# Patient Record
Sex: Female | Born: 1995 | Race: Black or African American | Hispanic: No | Marital: Single | Smoking: Never smoker
Health system: Southern US, Community
[De-identification: ages and names within clinical notes are randomized; demographics above are authoritative.]

---
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
## 2016-04-15 ENCOUNTER — Emergency Department (HOSPITAL_COMMUNITY)
Admission: EM | Admit: 2016-04-15 | Discharge: 2016-04-15 | Disposition: A | Discharge status: Home / Self Care | Payer: Managed Care, Other (non HMO) | Attending: Emergency Medicine | Admitting: Emergency Medicine

## 2016-04-15 ENCOUNTER — Encounter (HOSPITAL_COMMUNITY): Payer: Self-pay | Admitting: Emergency Medicine

## 2016-04-15 ENCOUNTER — Ambulatory Visit (HOSPITAL_COMMUNITY)
Admission: EM | Admit: 2016-04-15 | Discharge: 2016-04-15 | Disposition: A | Discharge status: Home / Self Care | Payer: Managed Care, Other (non HMO) | Attending: Family Medicine | Admitting: Family Medicine

## 2016-04-15 DIAGNOSIS — R1030 Lower abdominal pain, unspecified: Secondary | ICD-10-CM | POA: Diagnosis present

## 2016-04-15 DIAGNOSIS — Z5321 Procedure and treatment not carried out due to patient leaving prior to being seen by health care provider: Secondary | ICD-10-CM | POA: Insufficient documentation

## 2016-04-15 LAB — URINALYSIS, MICROSCOPIC (REFLEX)

## 2016-04-15 LAB — URINALYSIS, ROUTINE W REFLEX MICROSCOPIC
Glucose, UA: NEGATIVE mg/dL
Ketones, ur: NEGATIVE mg/dL
Nitrite: POSITIVE — AB
PH: 8.5 — AB (ref 5.0–8.0)
Protein, ur: 100 mg/dL — AB
Specific Gravity, Urine: 1.02 (ref 1.005–1.030)

## 2016-04-15 LAB — POC URINE PREG, ED: Preg Test, Ur: NEGATIVE

## 2016-04-15 NOTE — ED Notes (Signed)
Patient left ED.  

## 2016-04-15 NOTE — ED Triage Notes (Signed)
Pt sent for lower abd pain in ovaries; pt sts had negative pregnancy test today at PCP; pt sts currently on period

## 2016-11-19 ENCOUNTER — Other Ambulatory Visit (HOSPITAL_COMMUNITY): Payer: Self-pay | Admitting: General Practice

## 2016-11-19 ENCOUNTER — Emergency Department (HOSPITAL_COMMUNITY): Payer: Self-pay

## 2016-11-19 ENCOUNTER — Emergency Department (HOSPITAL_COMMUNITY)
Admission: EM | Admit: 2016-11-19 | Discharge: 2016-11-20 | Disposition: A | Discharge status: Home / Self Care | Payer: Self-pay | Attending: Emergency Medicine | Admitting: Emergency Medicine

## 2016-11-19 ENCOUNTER — Encounter (HOSPITAL_COMMUNITY): Payer: Self-pay | Admitting: Emergency Medicine

## 2016-11-19 DIAGNOSIS — N739 Female pelvic inflammatory disease, unspecified: Secondary | ICD-10-CM | POA: Insufficient documentation

## 2016-11-19 DIAGNOSIS — R112 Nausea with vomiting, unspecified: Secondary | ICD-10-CM | POA: Insufficient documentation

## 2016-11-19 DIAGNOSIS — R102 Pelvic and perineal pain: Secondary | ICD-10-CM

## 2016-11-19 DIAGNOSIS — N730 Acute parametritis and pelvic cellulitis: Secondary | ICD-10-CM

## 2016-11-19 LAB — URINALYSIS, ROUTINE W REFLEX MICROSCOPIC
BILIRUBIN URINE: NEGATIVE
Glucose, UA: NEGATIVE mg/dL
Ketones, ur: NEGATIVE mg/dL
LEUKOCYTES UA: NEGATIVE
NITRITE: POSITIVE — AB
PROTEIN: NEGATIVE mg/dL
Specific Gravity, Urine: 1.02 (ref 1.005–1.030)
pH: 5 (ref 5.0–8.0)

## 2016-11-19 LAB — COMPREHENSIVE METABOLIC PANEL
ALT: 15 U/L (ref 14–54)
ANION GAP: 11 (ref 5–15)
AST: 19 U/L (ref 15–41)
Albumin: 4.4 g/dL (ref 3.5–5.0)
Alkaline Phosphatase: 72 U/L (ref 38–126)
BILIRUBIN TOTAL: 0.7 mg/dL (ref 0.3–1.2)
BUN: 11 mg/dL (ref 6–20)
CHLORIDE: 104 mmol/L (ref 101–111)
CO2: 25 mmol/L (ref 22–32)
Calcium: 9.6 mg/dL (ref 8.9–10.3)
Creatinine, Ser: 0.77 mg/dL (ref 0.44–1.00)
GFR calc Af Amer: >60 mL/min (ref >60)
GFR calc non Af Amer: >60 mL/min (ref >60)
GLUCOSE: 88 mg/dL (ref 65–99)
POTASSIUM: 3.2 mmol/L — AB (ref 3.5–5.1)
SODIUM: 140 mmol/L (ref 135–145)
TOTAL PROTEIN: 8.8 g/dL — AB (ref 6.5–8.1)

## 2016-11-19 LAB — WET PREP, GENITAL
Clue Cells Wet Prep HPF POC: NONE SEEN
Sperm: NONE SEEN
Trich, Wet Prep: NONE SEEN
Yeast Wet Prep HPF POC: NONE SEEN

## 2016-11-19 LAB — CBC
HEMATOCRIT: 35.3 % — AB (ref 36.0–46.0)
HEMOGLOBIN: 12 g/dL (ref 12.0–15.0)
MCH: 29.9 pg (ref 26.0–34.0)
MCHC: 34 g/dL (ref 30.0–36.0)
MCV: 88 fL (ref 78.0–100.0)
Platelets: 370 10*3/uL (ref 150–400)
RBC: 4.01 MIL/uL (ref 3.87–5.11)
RDW: 13.6 % (ref 11.5–15.5)
WBC: 14.9 10*3/uL — ABNORMAL HIGH (ref 4.0–10.5)

## 2016-11-19 LAB — I-STAT BETA HCG BLOOD, ED (MC, WL, AP ONLY)

## 2016-11-19 LAB — LIPASE, BLOOD: LIPASE: 21 U/L (ref 11–51)

## 2016-11-19 MED ORDER — ONDANSETRON HCL 4 MG/2ML IJ SOLN
4.0000 mg | Freq: Once | INTRAMUSCULAR | Status: AC
  Administered 2016-11-19: 4 mg via INTRAVENOUS
  Filled 2016-11-19: qty 2

## 2016-11-19 MED ORDER — IOPAMIDOL (ISOVUE-300) INJECTION 61%
INTRAVENOUS | Status: AC
  Filled 2016-11-19: qty 100

## 2016-11-19 MED ORDER — IOPAMIDOL (ISOVUE-300) INJECTION 61%
100.0000 mL | Freq: Once | INTRAVENOUS | Status: AC | PRN
  Administered 2016-11-19: 100 mL via INTRAVENOUS

## 2016-11-19 MED ORDER — MORPHINE SULFATE (PF) 2 MG/ML IV SOLN
4.0000 mg | Freq: Once | INTRAVENOUS | Status: AC
  Administered 2016-11-19: 4 mg via INTRAVENOUS
  Filled 2016-11-19: qty 2

## 2016-11-19 MED ORDER — SODIUM CHLORIDE 0.9 % IV BOLUS (SEPSIS)
1000.0000 mL | Freq: Once | INTRAVENOUS | Status: AC
  Administered 2016-11-19: 1000 mL via INTRAVENOUS

## 2016-11-19 NOTE — ED Triage Notes (Signed)
Pt complaint of lower abd/pelvic pain with associated nausea; menstrual ended yesterday. No vaginal bleeding/discharge today.

## 2016-11-19 NOTE — ED Provider Notes (Signed)
[facility]-EMERGENCY DEPT Provider Note   CSN: [id number] Arrival date & time: 11/19/16  1616     History   Chief Complaint Chief Complaint  Patient presents with  . Abdominal Pain    HPI Sally Beltran is a 21 y.o. female.  The history is provided by the patient.  Abdominal Pain   This is a recurrent problem. The current episode started yesterday. Episode frequency: intemittent. The problem has not changed since onset.Associated with: menstrual cycle. The pain is located in the RLQ and LLQ. The quality of the pain is cramping. The pain is moderate. Associated symptoms include nausea and vomiting (nbnb). Pertinent negatives include fever, diarrhea, dysuria, hematuria and arthralgias. The symptoms are aggravated by activity. Nothing relieves the symptoms.   LMP: 11/12/16 and just finished yesterday. No prior STDs.   History reviewed. No pertinent past medical history.  There are no active problems to display for this patient.   History reviewed. No pertinent surgical history.  OB History    No data available       Home Medications    Prior to Admission medications   Not on File    Family History No family history on file.  Social History Social History  Substance Use Topics  . Smoking status: Never Smoker  . Smokeless tobacco: Never Used  . Alcohol use No     Allergies   Patient has no known allergies.   Review of Systems Review of Systems  Constitutional: Negative for chills and fever.  HENT: Negative for ear pain and sore throat.   Eyes: Negative for pain and visual disturbance.  Respiratory: Negative for cough and shortness of breath.   Cardiovascular: Negative for chest pain and palpitations.  Gastrointestinal: Positive for abdominal pain, nausea and vomiting (nbnb). Negative for diarrhea.  Genitourinary: Positive for pelvic pain. Negative for dysuria, hematuria, vaginal bleeding and vaginal discharge.  Musculoskeletal: Negative for arthralgias and back  pain.  Skin: Negative for color change and rash.  Neurological: Negative for seizures and syncope.  All other systems reviewed and are negative. All other systems are reviewed and are negative for acute change except as noted in the HPI    Physical Exam Updated Vital Signs BP (!) 130/105 (BP Location: Right Arm)   Pulse (!) 101   Temp 99.5 F (37.5 C) (Oral)   Resp 18   Ht 4\' 10"  (1.473 m)   Wt 76.5 kg (168 lb 9.6 oz)   LMP 11/12/2016   SpO2 100%   BMI 35.24 kg/m   Physical Exam  Constitutional: She is oriented to person, place, and time. She appears well-developed and well-nourished. No distress.  HENT:  Head: Normocephalic and atraumatic.  Nose: Nose normal.  Eyes: Pupils are equal, round, and reactive to light. Conjunctivae and EOM are normal. Right eye exhibits no discharge. Left eye exhibits no discharge. No scleral icterus.  Neck: Normal range of motion. Neck supple.  Cardiovascular: Normal rate and regular rhythm.  Exam reveals no gallop and no friction rub.   No murmur heard. Pulmonary/Chest: Effort normal and breath sounds normal. No stridor. No respiratory distress. She has no rales.  Abdominal: Soft. She exhibits no distension. There is tenderness in the right lower quadrant, suprapubic area and left lower quadrant. There is no rigidity, no rebound and no guarding. No hernia.  Genitourinary: Pelvic exam was performed with patient supine. Uterus is tender. Cervix exhibits motion tenderness and discharge. Cervix exhibits no friability. Right adnexum displays tenderness and fullness. Right adnexum displays  no mass. Left adnexum displays tenderness. Left adnexum displays no mass. No erythema or bleeding in the vagina. No foreign body in the vagina. No signs of injury around the vagina. Vaginal discharge (moderate yellow discharge) found.  Genitourinary Comments: Chaperone present during pelvic exam.   Musculoskeletal: She exhibits no edema or tenderness.  Neurological: She  is alert and oriented to person, place, and time.  Skin: Skin is warm and dry. No rash noted. She is not diaphoretic. No erythema.  Psychiatric: She has a normal mood and affect.  Vitals reviewed.    ED Treatments / Results  Labs (all labs ordered are listed, but only abnormal results are displayed) Labs Reviewed  WET PREP, GENITAL - Abnormal; Notable for the following:       Result Value   WBC, Wet Prep HPF POC MANY (*)    All other components within normal limits  COMPREHENSIVE METABOLIC PANEL - Abnormal; Notable for the following:    Potassium 3.2 (*)    Total Protein 8.8 (*)    All other components within normal limits  CBC - Abnormal; Notable for the following:    WBC 14.9 (*)    HCT 35.3 (*)    All other components within normal limits  URINALYSIS, ROUTINE W REFLEX MICROSCOPIC - Abnormal; Notable for the following:    APPearance HAZY (*)    Hgb urine dipstick SMALL (*)    Nitrite POSITIVE (*)    Bacteria, UA RARE (*)    Squamous Epithelial / LPF 0-5 (*)    All other components within normal limits  LIPASE, BLOOD  I-STAT BETA HCG BLOOD, ED ([facility], [facility], [facility] ONLY)  GC/CHLAMYDIA PROBE AMP ([place]) NOT AT [facility]    EKG  EKG Interpretation None       Radiology No results found.  Procedures Procedures (including critical care time)  Medications Ordered in ED Medications  iopamidol (ISOVUE-300) 61 % injection (not administered)  sodium chloride 0.9 % bolus 1,000 mL (1,000 mLs Intravenous New Bag/Given 11/19/16 2304)  ondansetron (ZOFRAN) injection 4 mg (4 mg Intravenous Given 11/19/16 2304)  morphine 2 MG/ML injection 4 mg (4 mg Intravenous Given 11/19/16 2304)  iopamidol (ISOVUE-300) 61 % injection 100 mL (100 mLs Intravenous Contrast Given 11/19/16 2338)     Initial Impression / Assessment and Plan / ED Course  I have reviewed the triage vital signs and the nursing notes.  Pertinent labs & imaging results that were available during my care of the patient were  reviewed by me and considered in my medical decision making (see chart for details).     Lower abdominal pain for 2 days.  Exam without evidence of cervicitis however patient does have moderate purulent discharge. Severe lower abdominal tenderness, worse in the right lower quadrant and right adnexa.  Labs with leukocytosis. Beta hCG negative. Wet prep with positive leukocytes but no evidence of Trichomonas or clue cells. GC/chlamydia swab was sent.  Presentation is more concerning for appendicitis versus PID versus TOA.  We'll obtain CT scan. CT revealed right adnexal fluid collection more concerning for TOA. Pelvic ultrasound ordered for that her evaluation.  Patient care turned over to Dr Wilkie AyeHorton at 0000. Patient case and results discussed in detail; please see their note for further ED managment.       Nira Connardama, Heavyn Yearsley Eduardo, MD 11/19/16 [phone]

## 2016-11-20 ENCOUNTER — Emergency Department (HOSPITAL_COMMUNITY): Payer: Self-pay

## 2016-11-20 MED ORDER — AZITHROMYCIN 250 MG PO TABS
1000.0000 mg | ORAL_TABLET | Freq: Once | ORAL | Status: AC
  Administered 2016-11-20: 1000 mg via ORAL
  Filled 2016-11-20: qty 4

## 2016-11-20 MED ORDER — LIDOCAINE HCL 1 % IJ SOLN
INTRAMUSCULAR | Status: AC
  Administered 2016-11-20: 02:00:00
  Filled 2016-11-20: qty 20

## 2016-11-20 MED ORDER — DOXYCYCLINE HYCLATE 100 MG PO CAPS: 100.0000 mg | ORAL_CAPSULE | Freq: Two times a day (BID) | ORAL | 0 refills | Status: AC

## 2016-11-20 MED ORDER — CEFTRIAXONE SODIUM 250 MG IJ SOLR
250.0000 mg | Freq: Once | INTRAMUSCULAR | Status: AC
  Administered 2016-11-20: 250 mg via INTRAMUSCULAR
  Filled 2016-11-20: qty 250

## 2016-11-20 NOTE — Discharge Instructions (Signed)
You were seen today for abdominal pain. You may have pelvic inflammatory disease which is sometimes associated with STDs. You're tested and treated for STDs. You will be placed on antibiotics. If you have any new or worsening symptoms she should follow-up immediately. [facility] provided at [facility].

## 2016-11-20 NOTE — ED Provider Notes (Signed)
Patient signed out pending ultrasound.  Ultrasound shows inflammation of the pelvis but no fluid collection. I discussed the results with the patient. She was given IM Rocephin and azithromycin to cover for STDs. She will also be sent home on doxycycline with concerns for pelvic inflammatory disease. She was given strict return precautions. She is nontoxic on repeat evaluation and her questions were answered.  After history, exam, and medical workup I feel the patient has been appropriately medically screened and is safe for discharge home. Pertinent diagnoses were discussed with the patient. Patient was given return precautions.    Shon BatonHorton, Oceana Walthall F, MD 11/20/16 [phone]

## 2016-11-21 LAB — GC/CHLAMYDIA PROBE AMP (~~LOC~~) NOT AT ARMC
Chlamydia: NEGATIVE
Neisseria Gonorrhea: POSITIVE — AB

## 2017-08-20 IMAGING — US US ART/VEN ABD/PELV/SCROTUM DOPPLER LTD
1 series · 13 of 25 positions shown · non-contrast
Comparison: CT 11/19/2016

CLINICAL DATA: Pelvic pain and pressure.  Nausea.  Duration 1 day.

EXAM:
TRANSABDOMINAL AND TRANSVAGINAL ULTRASOUND OF PELVIS
DOPPLER ULTRASOUND OF OVARIES
TECHNIQUE: Both transabdominal and transvaginal ultrasound examinations of the
pelvis were performed. Transabdominal technique was performed for
global imaging of the pelvis including uterus, ovaries, adnexal
regions, and pelvic cul-de-sac.
It was necessary to proceed with endovaginal exam following the
transabdominal exam to visualize the ovaries. Color and duplex
Doppler ultrasound was utilized to evaluate blood flow to the
ovaries.

[Series 1: us art/ven abd/pelv/scrotum doppler ltd · 0.22mm/px · 79 acquisitions, 13 frames shown]
[im 1/79]
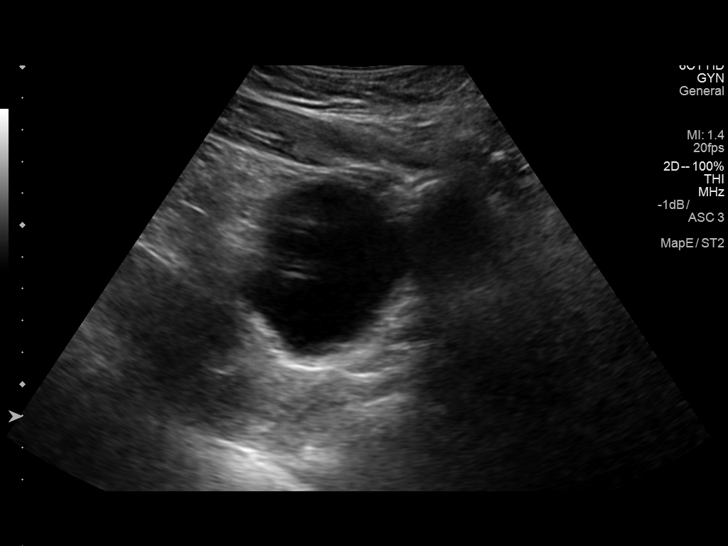
[im 7/79]
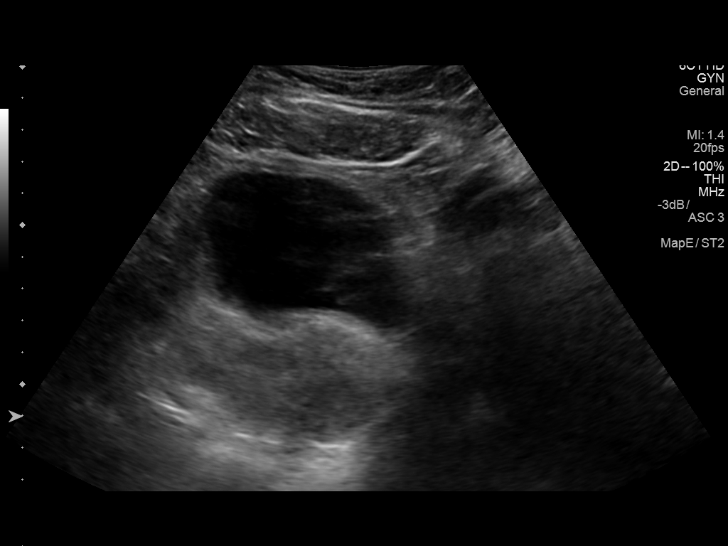
[im 14/79]
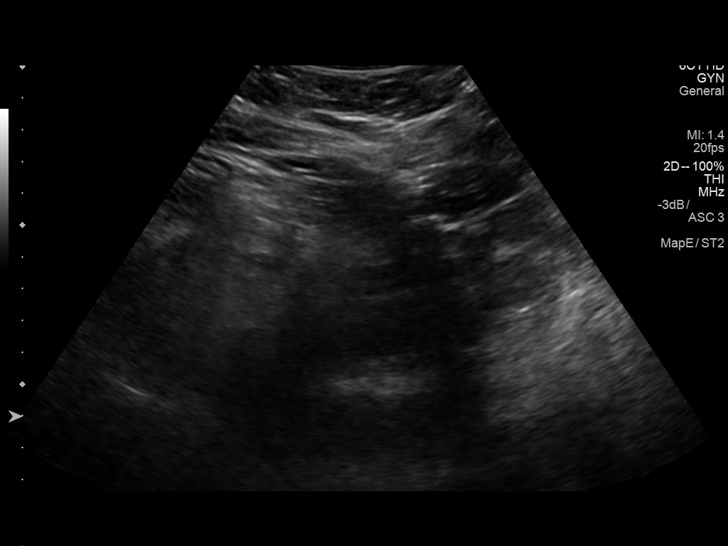
[im 20/79]
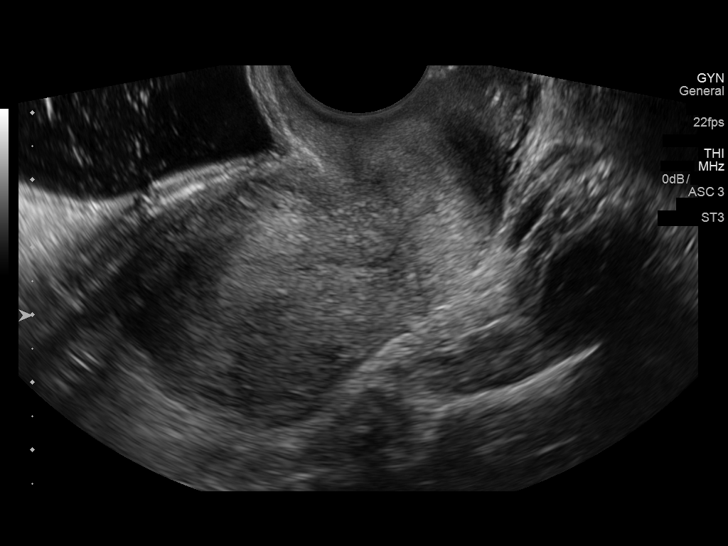
[im 27/79]
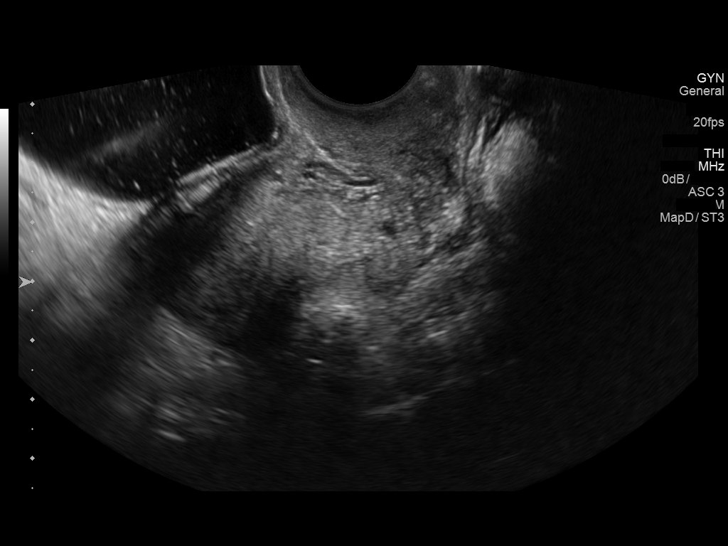
[im 33/79]
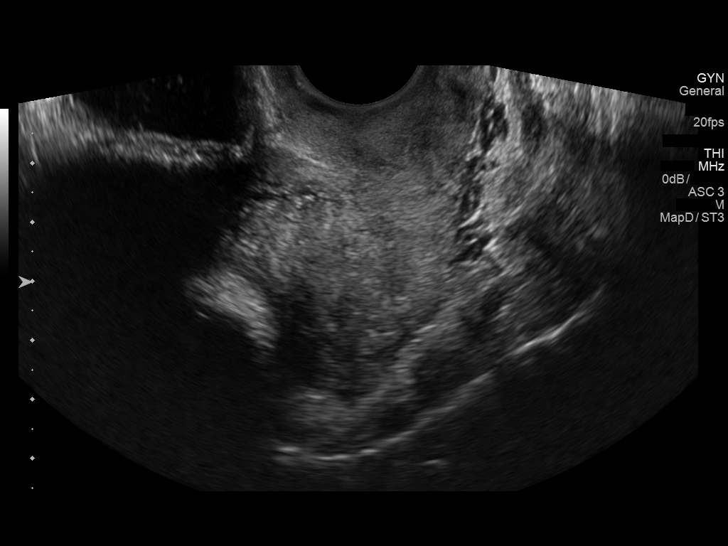
[im 40/79]
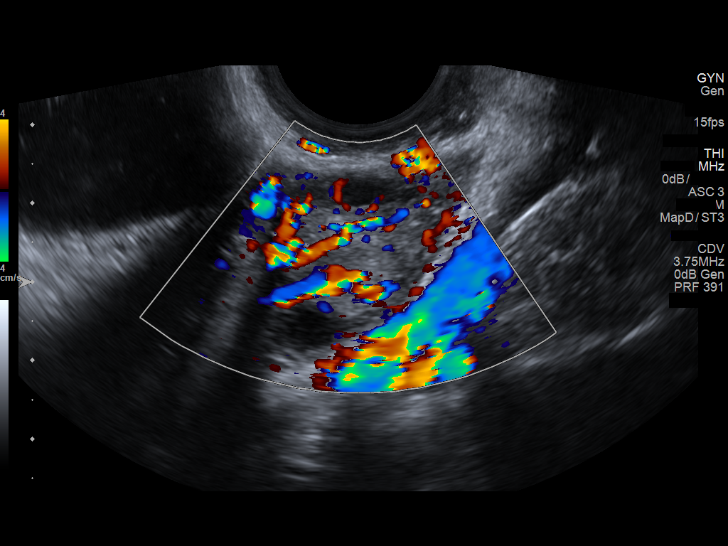
[im 46/79]
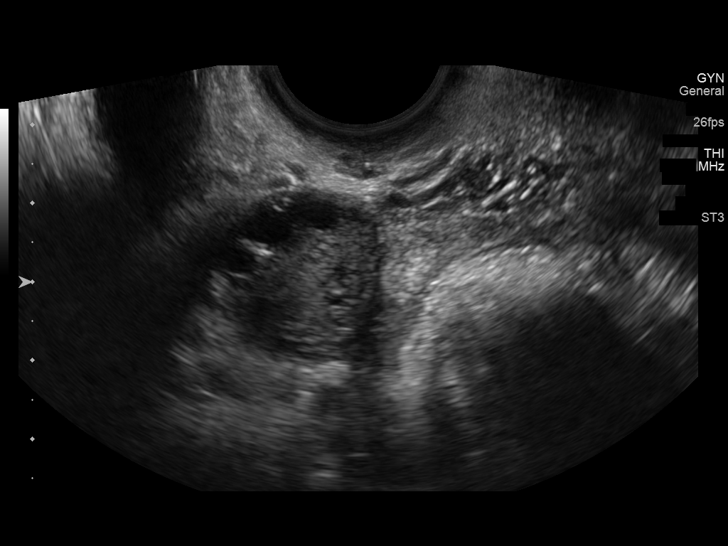
[im 53/79]
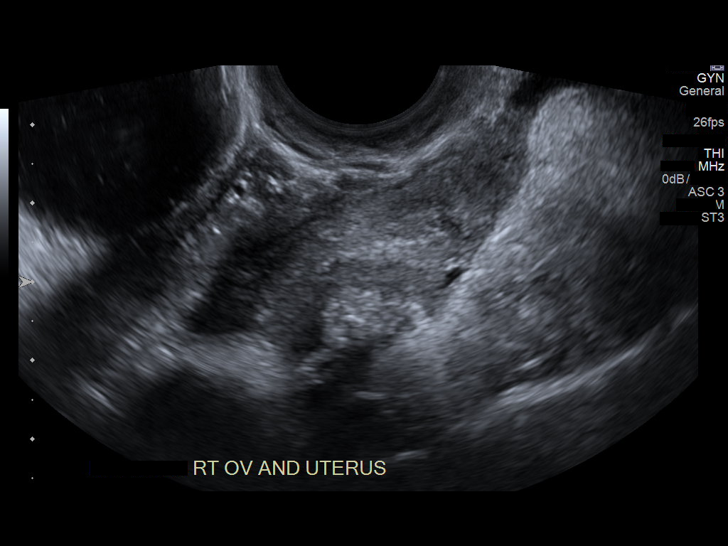
[im 59/79]
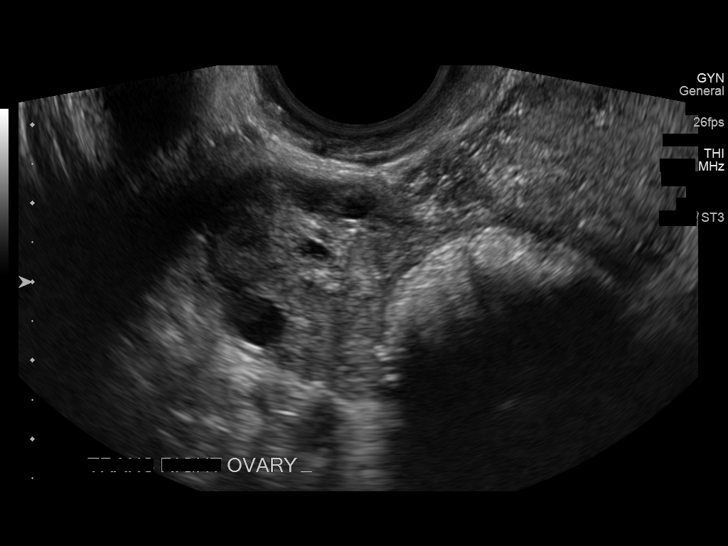
[im 66/79]
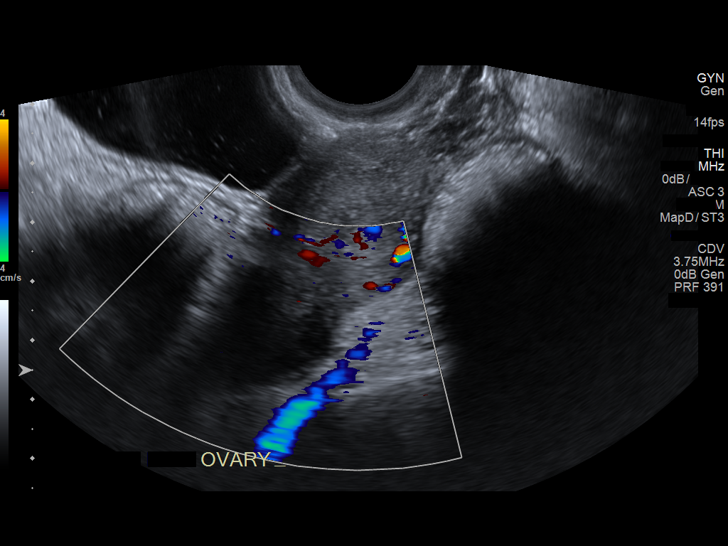
[im 72/79]
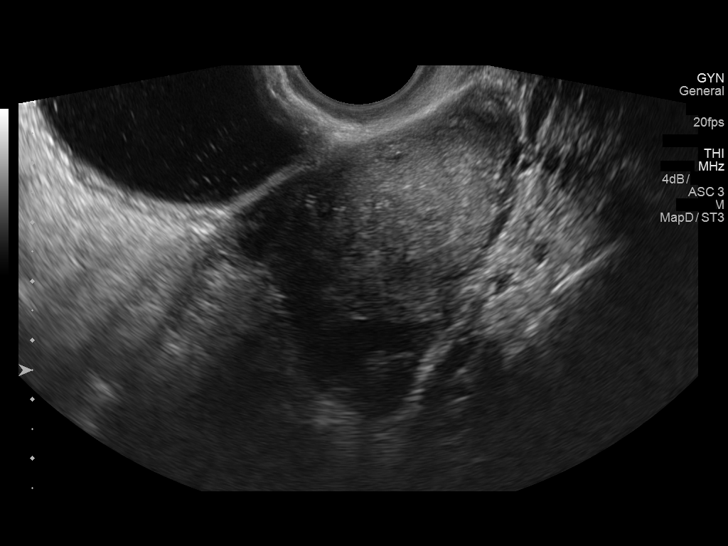
[im 79/79]
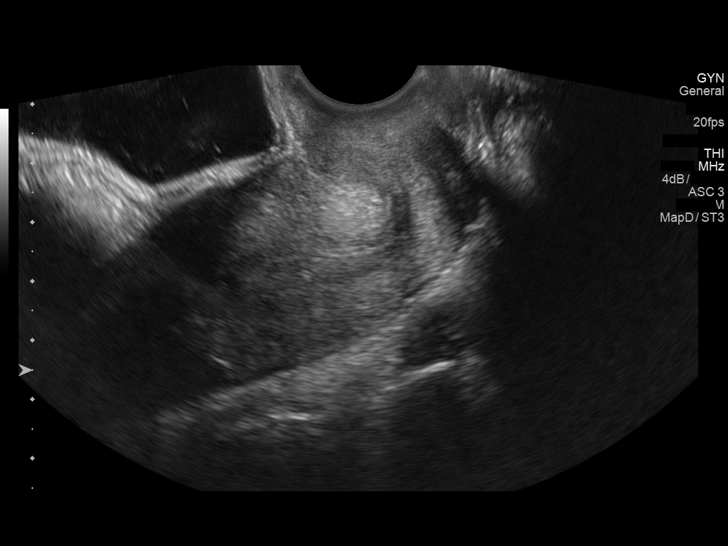

[13 of 25 positions shown; findings below may reference images not displayed]

FINDINGS: Uterus

Measurements: 6.3 x 3.4 x 3.7 cm. No fibroids or other mass
visualized.

Endometrium

Thickness: 3.6 mm.  No focal abnormality visualized.

Right ovary

Measurements: 3.6 x 2.0 x 2.2 cm. The ovary appears normal. There is
prominent soft tissue between the ovary and the right lateral aspect
of the uterus, seen to best advantage on the axial cine transverse
right adnexa series. This could be inflammatory, but there is no
evidence of a drainable tubo-ovarian abscess.

Left ovary

Measurements: 3.1 x 2.2 x 2.4 cm. Normal appearance/no adnexal mass.

Pulsed Doppler evaluation of both ovaries demonstrates normal
low-resistance arterial and venous waveforms.

Other findings

No abnormal free fluid.
IMPRESSION: 1. Prominent soft tissues between the right ovary and the uterus,
possibly inflammatory. Pyosalpinx not excluded, but there is no
drainable abscess or collection.
2. Intact perfusion of both ovaries on Doppler.

3. Normal appearances of the uterus and ovaries. No free pelvic
fluid.

## 2019-08-22 ENCOUNTER — Other Ambulatory Visit (INDEPENDENT_AMBULATORY_CARE_PROVIDER_SITE_OTHER): Payer: Self-pay | Admitting: Student in an Organized Health Care Education/Training Program
